# Patient Record
Sex: Female | Born: 1950 | Race: White | Hispanic: No | Smoking: Former smoker
Health system: Southern US, Community
[De-identification: ages and names within clinical notes are randomized; demographics above are authoritative.]

## PROBLEM LIST (undated history)

## (undated) DIAGNOSIS — I2510 Atherosclerotic heart disease of native coronary artery without angina pectoris: Secondary | ICD-10-CM

## (undated) DIAGNOSIS — E039 Hypothyroidism, unspecified: Secondary | ICD-10-CM

## (undated) DIAGNOSIS — M069 Rheumatoid arthritis, unspecified: Secondary | ICD-10-CM

## (undated) DIAGNOSIS — I5189 Other ill-defined heart diseases: Secondary | ICD-10-CM

## (undated) DIAGNOSIS — E119 Type 2 diabetes mellitus without complications: Secondary | ICD-10-CM

## (undated) DIAGNOSIS — I739 Peripheral vascular disease, unspecified: Secondary | ICD-10-CM

## (undated) DIAGNOSIS — Z720 Tobacco use: Secondary | ICD-10-CM

## (undated) HISTORY — DX: Atherosclerotic heart disease of native coronary artery without angina pectoris: I25.10

## (undated) HISTORY — DX: Rheumatoid arthritis, unspecified: M06.9

## (undated) HISTORY — PX: THYROID SURGERY: SHX805

## (undated) HISTORY — DX: Type 2 diabetes mellitus without complications: E11.9

## (undated) HISTORY — DX: Hypothyroidism, unspecified: E03.9

## (undated) HISTORY — DX: Tobacco use: Z72.0

## (undated) HISTORY — DX: Other ill-defined heart diseases: I51.89

## (undated) HISTORY — PX: LYMPHADENECTOMY: SHX15

## (undated) HISTORY — DX: Peripheral vascular disease, unspecified: I73.9

## (undated) HISTORY — PX: OTHER SURGICAL HISTORY: SHX169

## (undated) HISTORY — PX: CHOLECYSTECTOMY: SHX55

---

## 2010-07-05 ENCOUNTER — Encounter
Admission: RE | Admit: 2010-07-05 | Discharge: 2010-07-05 | Payer: Self-pay | Source: Home / Self Care | Attending: Internal Medicine | Admitting: Internal Medicine

## 2011-07-23 ENCOUNTER — Other Ambulatory Visit: Payer: Self-pay | Admitting: Interventional Cardiology

## 2011-07-24 ENCOUNTER — Inpatient Hospital Stay (HOSPITAL_BASED_OUTPATIENT_CLINIC_OR_DEPARTMENT_OTHER)
Admission: RE | Admit: 2011-07-24 | Discharge: 2011-07-24 | Disposition: A | Discharge status: Home / Self Care | Payer: 59 | Source: Ambulatory Visit | Attending: Interventional Cardiology | Admitting: Interventional Cardiology

## 2011-07-24 ENCOUNTER — Encounter (HOSPITAL_BASED_OUTPATIENT_CLINIC_OR_DEPARTMENT_OTHER)
Admission: RE | Disposition: A | Discharge status: Home / Self Care | Payer: Self-pay | Source: Ambulatory Visit | Attending: Interventional Cardiology

## 2011-07-24 DIAGNOSIS — R079 Chest pain, unspecified: Secondary | ICD-10-CM | POA: Insufficient documentation

## 2011-07-24 DIAGNOSIS — I2510 Atherosclerotic heart disease of native coronary artery without angina pectoris: Secondary | ICD-10-CM | POA: Insufficient documentation

## 2011-07-24 LAB — POCT I-STAT GLUCOSE: Operator id: 141321

## 2011-07-24 SURGERY — JV LEFT HEART CATHETERIZATION WITH CORONARY ANGIOGRAM
Anesthesia: Moderate Sedation

## 2011-07-24 MED ORDER — SODIUM CHLORIDE 0.9 % IV SOLN
INTRAVENOUS | Status: AC
Start: 2011-07-24 — End: 2011-07-24

## 2011-07-24 MED ORDER — SODIUM CHLORIDE 0.9 % IJ SOLN: 3.0000 mL | INTRAMUSCULAR | Status: DC | PRN

## 2011-07-24 MED ORDER — ISOSORBIDE MONONITRATE ER 30 MG PO TB24: 30.0000 mg | ORAL_TABLET | Freq: Every day | ORAL | Status: DC

## 2011-07-24 MED ORDER — SODIUM CHLORIDE 0.9 % IV SOLN: 250.0000 mL | INTRAVENOUS | Status: DC | PRN

## 2011-07-24 MED ORDER — SODIUM CHLORIDE 0.9 % IJ SOLN: 3.0000 mL | Freq: Two times a day (BID) | INTRAMUSCULAR | Status: DC

## 2011-07-24 MED ORDER — SODIUM CHLORIDE 0.9 % IV SOLN
INTRAVENOUS | Status: DC
  Administered 2011-07-24: 13:00:00 via INTRAVENOUS

## 2011-07-24 MED ORDER — ASPIRIN 81 MG PO CHEW
324.0000 mg | CHEWABLE_TABLET | ORAL | Status: AC
  Administered 2011-07-24: 324 mg via ORAL

## 2011-07-24 MED ORDER — ONDANSETRON HCL 4 MG/2ML IJ SOLN: 4.0000 mg | Freq: Four times a day (QID) | INTRAMUSCULAR | Status: DC | PRN

## 2011-07-24 MED ORDER — MORPHINE SULFATE 2 MG/ML IJ SOLN: 1.0000 mg | INTRAMUSCULAR | Status: DC | PRN

## 2011-07-24 MED ORDER — ACETAMINOPHEN 325 MG PO TABS: 650.0000 mg | ORAL_TABLET | ORAL | Status: DC | PRN

## 2011-07-24 MED ORDER — DIAZEPAM 5 MG PO TABS
5.0000 mg | ORAL_TABLET | ORAL | Status: AC
  Administered 2011-07-24: 5 mg via ORAL

## 2011-07-24 MED ORDER — ASPIRIN 81 MG PO CHEW: 81.0000 mg | CHEWABLE_TABLET | Freq: Every day | ORAL | Status: DC

## 2011-07-24 NOTE — Progress Notes (Signed)
Discharge instructions completed, patient voiced verbal understanding, sister-in-law present while instructions given.  Ambulated to bathroom without bleeding from right groin site.  IV discontinued.  Discharged to home via wheelchair with sister-in-law.

## 2011-07-24 NOTE — OR Nursing (Signed)
Tegaderm dressing applied, site level 0, bedrest begins at 1345

## 2011-07-24 NOTE — H&P (Signed)
Date of Initial H&P: 07/22/11  History reviewed, patient examined, no change in status, stable for surgery.

## 2011-07-24 NOTE — Op Note (Signed)
PROCEDURE:  Left heart catheterization with selective coronary angiography, left ventriculogram.  INDICATIONS:  Persistent chest pain  The risks, benefits, and details of the procedure were explained to the patient.  The patient verbalized understanding and wanted to proceed.  Informed written consent was obtained.  PROCEDURE TECHNIQUE:  After Xylocaine anesthesia a 29F sheath was placed in the right femoral artery with a single anterior needle wall stick.   Left coronary angiography was done using a Judkins L4 guide catheter.  Right coronary angiography was done using a Judkins R4 guide catheter.  Left ventriculography was done using a pigtail catheter.  The pigtail catheter was withdrawn to the abdominal aorta and a power injection of contrast was performed in the AP projection to image the abdominal aorta.   CONTRAST:  Total of 70 cc.  COMPLICATIONS:  None.    HEMODYNAMICS:  Aortic pressure was 82/46; LV pressure was 82/5; LVEDP 6.  There was no gradient between the left ventricle and aorta.    ANGIOGRAPHIC DATA:   The left main coronary artery is widely patent.  The left anterior descending artery is a large vessel which reaches.  The vessel appears angiographically normal.  The distal vessel is small..  The left circumflex artery is a large dominant vessel.  There is a high OM 1.  There is an ostial 25% stenosis in the OM 1.  This is a very small vessel.  There is a large branching OM 2.  In the lower pole, there is a focal 40% stenosis.  This is also a fairly small vessel.  There is a medium-sized OM 3.  This appears widely patent.  The left PDA is widely patent.  The right coronary artery is a small codominant vessel.  The proximal vessel is a medium-sized.  There is an early bifurcation of the posterolateral and PDA.  After nitroglycerin, right coronary artery did increase somewhat in size.  In the mid vessel, just before the bifurcation of the PDA and posterior lateral there is a focal  70% stenosis.  The distal PDA is small.  LEFT VENTRICULOGRAM:  Left ventricular angiogram was done in the 30 RAO projection and revealed normal left ventricular wall motion and systolic function with an estimated ejection fraction of 60%.  LVEDP was 6 mmHg.  ABDOMINAL AORTOGRAM:  Mild plaque noted in the infrarenal aorta.  Bilateral single renal arteries.  Left renal artery is widely patent.  Right renal artery has an ostial 40% stenosis.  IMPRESSIONS:  1. Normal left main coronary artery. 2. Normal left anterior descending artery and its branches. 3. Normal left circumflex artery.  Mild disease in the circumflex artery branches. 4.  small right coronary artery.  In the mid vessel, there is a focal 70% stenosis.  This vessel is quite small and is likely smaller than our smallest stent.  He  5. Normal left ventricular systolic function.  LVEDP 6  mmHg.  Ejection fraction 60%. 6.   no abdominal aortic aneurysm.  40% ostial right renal artery stenosis.  RECOMMENDATION:  Normal flow in the left system.  Moderate disease in the mid right coronary artery which is quite small.  This is not a candidate for percutaneous intervention due to the fact that the vessel is smaller than the smallest stent available.  We'll start medical therapy with him  Imdur.  Consider adding Plavix.  For now, start baby aspirin.

## 2011-12-10 ENCOUNTER — Other Ambulatory Visit: Payer: Self-pay | Admitting: Internal Medicine

## 2011-12-10 DIAGNOSIS — C73 Malignant neoplasm of thyroid gland: Secondary | ICD-10-CM

## 2011-12-20 ENCOUNTER — Other Ambulatory Visit: Payer: 59

## 2011-12-24 ENCOUNTER — Other Ambulatory Visit: Payer: 59

## 2011-12-27 ENCOUNTER — Ambulatory Visit
Admission: RE | Admit: 2011-12-27 | Discharge: 2011-12-27 | Disposition: A | Discharge status: Home / Self Care | Payer: 59 | Source: Ambulatory Visit | Attending: Internal Medicine | Admitting: Internal Medicine

## 2011-12-27 DIAGNOSIS — C73 Malignant neoplasm of thyroid gland: Secondary | ICD-10-CM

## 2011-12-31 ENCOUNTER — Other Ambulatory Visit: Payer: Self-pay | Admitting: Internal Medicine

## 2011-12-31 DIAGNOSIS — E041 Nontoxic single thyroid nodule: Secondary | ICD-10-CM

## 2012-01-15 ENCOUNTER — Other Ambulatory Visit (HOSPITAL_COMMUNITY)
Admission: RE | Admit: 2012-01-15 | Discharge: 2012-01-15 | Disposition: A | Discharge status: Home / Self Care | Payer: 59 | Source: Ambulatory Visit | Attending: Interventional Radiology | Admitting: Interventional Radiology

## 2012-01-15 ENCOUNTER — Other Ambulatory Visit: Payer: Self-pay | Admitting: Internal Medicine

## 2012-01-15 ENCOUNTER — Ambulatory Visit
Admission: RE | Admit: 2012-01-15 | Discharge: 2012-01-15 | Disposition: A | Discharge status: Home / Self Care | Payer: 59 | Source: Ambulatory Visit | Attending: Internal Medicine | Admitting: Internal Medicine

## 2012-01-15 DIAGNOSIS — E041 Nontoxic single thyroid nodule: Secondary | ICD-10-CM

## 2012-01-15 DIAGNOSIS — E049 Nontoxic goiter, unspecified: Secondary | ICD-10-CM | POA: Insufficient documentation

## 2013-05-18 ENCOUNTER — Ambulatory Visit: Payer: 59 | Admitting: Interventional Cardiology

## 2016-03-21 ENCOUNTER — Ambulatory Visit (INDEPENDENT_AMBULATORY_CARE_PROVIDER_SITE_OTHER): Payer: No Typology Code available for payment source | Admitting: Nurse Practitioner

## 2016-03-21 ENCOUNTER — Encounter: Payer: Self-pay | Admitting: Nurse Practitioner

## 2016-03-21 VITALS — BP 122/69 | HR 72 | Ht 63.0 in | Wt 117.0 lb

## 2016-03-21 DIAGNOSIS — I739 Peripheral vascular disease, unspecified: Secondary | ICD-10-CM | POA: Diagnosis not present

## 2016-03-21 DIAGNOSIS — E119 Type 2 diabetes mellitus without complications: Secondary | ICD-10-CM | POA: Insufficient documentation

## 2016-03-21 DIAGNOSIS — Z720 Tobacco use: Secondary | ICD-10-CM | POA: Insufficient documentation

## 2016-03-21 DIAGNOSIS — I25119 Atherosclerotic heart disease of native coronary artery with unspecified angina pectoris: Secondary | ICD-10-CM

## 2016-03-21 DIAGNOSIS — E1140 Type 2 diabetes mellitus with diabetic neuropathy, unspecified: Secondary | ICD-10-CM

## 2016-03-21 DIAGNOSIS — Z794 Long term (current) use of insulin: Secondary | ICD-10-CM

## 2016-03-21 DIAGNOSIS — I208 Other forms of angina pectoris: Secondary | ICD-10-CM | POA: Diagnosis not present

## 2016-03-21 DIAGNOSIS — E785 Hyperlipidemia, unspecified: Secondary | ICD-10-CM

## 2016-03-21 DIAGNOSIS — I2510 Atherosclerotic heart disease of native coronary artery without angina pectoris: Secondary | ICD-10-CM | POA: Insufficient documentation

## 2016-03-21 DIAGNOSIS — I5189 Other ill-defined heart diseases: Secondary | ICD-10-CM | POA: Insufficient documentation

## 2016-03-21 DIAGNOSIS — I119 Hypertensive heart disease without heart failure: Secondary | ICD-10-CM

## 2016-03-21 MED ORDER — ATORVASTATIN CALCIUM 40 MG PO TABS: 40.0000 mg | ORAL_TABLET | Freq: Every day | ORAL | 6 refills | Status: DC

## 2016-03-21 MED ORDER — ISOSORBIDE MONONITRATE ER 30 MG PO TB24: 30.0000 mg | ORAL_TABLET | Freq: Every day | ORAL | 6 refills | Status: DC

## 2016-03-21 NOTE — Patient Instructions (Signed)
Your physician has requested that you have a lower extremity arterial exercise duplex. During this test, exercise and ultrasound are used to evaluate arterial blood flow in the legs. Allow one hour for this exam. There are no restrictions or special instructions.   Your physician has requested that you have a lexiscan myoview. For further information please visit [email or web address]. Please follow instruction sheet, as given.  Your physician recommends that you schedule a follow-up appointment in: 1 month with Dr Berline Lopes or team member.

## 2016-03-21 NOTE — Progress Notes (Signed)
Cardiology Clinic Note   Patient Name: Tiffany James Date of Encounter: 03/21/2016  Primary Care Provider:  Kandice Hams, MD Primary Cardiologist:  Lendell Caprice, MD (last seen 07/2011)  Patient Profile    65 year old female with prior history of small vessel CAD, hypertension, hyperlipidemia, diabetes, and tobacco abuse who presents to reestablish care.  Past Medical History    Past Medical History:  Diagnosis Date  . CAD (coronary artery disease)    a. 07/2011 Cath: LM nl, LAD nl, LCX mild dzs in branches, RCA small, 66m, EF 60%, R Renal 40ost-->Med Rx.  . Claudication ([place])   . Diastolic dysfunction    a. 03/2016 Echo: EF 60-65%, mild asymmetric LVH, abnl diast fxn.  . Hypothyroidism    a. in setting of thyroid resection x 2-->TSH nl 03/2016, now off of synthroid.  . Rheumatoid arthritis ([place])   . Tobacco abuse   . Type 2 diabetes mellitus ([place])    Past Surgical History:  Procedure Laterality Date  . CESAREAN SECTION    . CHOLECYSTECTOMY    . Ectopic Pregnancy x 2    . LYMPHADENECTOMY    . Resection of Insulinoma    . THYROID SURGERY      Allergies  Allergies  Allergen Reactions  . Metformin And Related Nausea Only  . Secobarbital Sodium Anxiety    History of Present Illness    65 year old female with the above complex past medical history including nonobstructive CAD, hypertension, hyperlipidemia, diabetes, and tobacco abuse. She was last seen in our office by Dr. Irish Lack in January 2013 with complaints of chest pain. She underwent diagnostic cardiac catheterization in January 2013 which revealed moderate, small vessel right coronary artery disease of approximately 70%. LV function was normal. She was placed on Imdur therapy. The right coronary artery was not felt to be ideal for intervention because of its small size. She has not followed up in several years but says that on average, she experiences an episode of chest discomfort, generally with exertion, about 4-5  times per year, for which she will take a nitroglycerin with adequate relief. She has been out of her Imdur for about a year and says that over the past year, she has had some increase frequency of chest discomfort, now occurring several times per week. She also reports bilateral left greater than right calf claudication especially with walking up inclines. She does not express dyspnea on exertion. She continues to smoke about half a pack per day but with the news that she'll be having a grand child, but the spring of 2018, she is now much more motivated to quit and does plan on doing so.  She was recently hospitalized at [facility] because of bilateral lower extremity swelling. This is been occurring periodically for several weeks, most notable at the end of the day and absent first thing in the morning. When it was more persistent on one particular day, she presented to [facility]. I have reviewed her records in care everywhere today. She was treated with 1 dose of IV Lasix in the ER with significant improvement in edema. Her BNP was normal. Troponin was normal. Chest x-ray did not show heart failure. Echo was performed and showed normal LV function with diastolic dysfunction. Lower external child was negative for DVT. She was prescribed metoprolol and advised to follow-up with her primary care provider. She says that she has not been taking metoprolol because she has a history of low blood pressures and dizziness. She has  not had any significant lower extremity swelling since her ER visit. She denies any history of PND, orthopnea, palpitations, dizziness, syncope, or early satiety.  Home Medications    Prior to Admission medications   Medication Sig Start Date End Date Taking? Authorizing Provider  aspirin EC 81 MG tablet Take 1 tablet by mouth daily.   Yes Historical Provider, MD  insulin aspart (NOVOLOG) 100 UNIT/ML injection Inject into the skin as directed.   Yes Historical Provider, MD    insulin glargine (LANTUS) 100 UNIT/ML injection Inject 22 Units into the skin daily. 03/19/16  Yes Historical Provider, MD  nitroGLYCERIN (NITROSTAT) 0.4 MG SL tablet Place 1 tablet under the tongue as needed. X 3 doses   Yes Historical Provider, MD  atorvastatin (LIPITOR) 40 MG tablet Take 1 tablet (40 mg total) by mouth daily. 03/21/16 06/19/16  Rogelia Mire, NP  isosorbide mononitrate (IMDUR) 30 MG 24 hr tablet Take 1 tablet (30 mg total) by mouth daily. 03/21/16 06/19/16  Rogelia Mire, NP    Family History    Family History  Problem Relation Age of Onset  . Stroke Mother   . Esophageal cancer Mother   . Liver cancer Father 56    Social History    Social History   Social History  . Marital status: Widowed    Spouse name: N/A  . Number of children: N/A  . Years of education: N/A   Occupational History  . Radiology technician    Social History Main Topics  . Smoking status: Current Every Day Smoker    Packs/day: 0.50    Years: 23.00    Types: Cigarettes  . Smokeless tobacco: Never Used  . Alcohol use No  . Drug use: No  . Sexual activity: Not on file   Other Topics Concern  . Not on file   Social History Narrative   Lives in [place].  She has a large home and her son and dtr in law live on one side, while she lives on the other.  She does not routinely exercise.     Review of Systems    General:  No chills, fever, night sweats or weight changes.  Cardiovascular:  +++ exertional chest pain, no dyspnea on exertion, +++ dependent lower ext edema with recent hospitalization, no orthopnea, palpitations, paroxysmal nocturnal dyspnea. +++ L>R bilat LE claudication. Dermatological: No rash, lesions/masses Respiratory: No cough, dyspnea Urologic: No hematuria, dysuria Abdominal:   No nausea, vomiting, diarrhea, bright red blood per rectum, melena, or hematemesis Neurologic:  No visual changes, wkns, changes in mental status. All other systems reviewed and  are otherwise negative except as noted above.  Physical Exam    VS:  BP 122/69   Pulse 72   Ht 5\' 3"  (1.6 m)   Wt 117 lb (53.1 kg)   BMI 20.73 kg/m  , BMI Body mass index is 20.73 kg/m. GEN: Well nourished, well developed, in no acute distress.  HEENT: normal.  Neck: Supple, no JVD, carotid bruits, or masses. Cardiac: RRR, no murmurs, rubs, or gallops. No clubbing, cyanosis, edema.  Radials/DP/PT 1+ and equal bilaterally.  Respiratory:  Respirations regular and unlabored, clear to auscultation bilaterally. GI: Soft, nontender, nondistended, BS + x 4. MS: no deformity or atrophy. Skin: warm and dry, no rash. Neuro:  Strength and sensation are intact. Psych: Normal affect.  Accessory Clinical Findings    ECG - Regular sinus rhythm, 72, no acute ST or T changes  Assessment & Plan  1.  Chronic angina/CAD: Patient has a long history of left-sided chest discomfort that has been nitrate responsive in the past. Diagnostic catheterization in January 2013 revealed small vessel right coronary artery disease. She was initially medically treated with isosorbide mononitrate but ran out of this some time ago. She is interested in refilling this. Over the past year, she has had some progression of chest discomfort symptoms, now occurring a few times per week, generally with exertion, though also sometimes @ rest.  She will often take a sublingual nitroglycerin when symptoms occur and this does seem to help. Symptoms can last up to hours. Given prior history of small vessel coronary disease with ongoing risk factors including tobacco abuse and poorly controlled diabetes, I will arrange for a Lexiscan Myoview to assess for ischemia.  She will continue on aspirin and I  have represcribed Imdur 30 mg daily. Given risk factors, I have also prescribed Lipitor 40 mg. She says she has been on a statin before but stopped it for no particular reason. She was recently prescribed beta blocker therapy but  discontinued it on her own because she felt as though she was getting lightheaded when standing. She can remain off of it for now.   2. Hypertensive heart disease: Blood pressure is currently stable. As noted above, she had been prescribed metoprolol during her recent hospitalization at [facility]. She has not been taking this. As I'm starting isosorbide mononitrate, she can remain off of metoprolol for the time being.  3. Hyperlipidemia: She had previously been prescribed Lipitor but came off of it because she didn't feel like taking it. With her history of coronary disease and diabetes, she should be on a statin. I will resume this. We will plan to follow-up lipids and LFTs in 6 weeks.  4. Type 2 diabetes mellitus: This was recently identified a poorly controlled with a hemoglobin A1c of 11.6. She has follow-up with primary care. Adding statin as above. She was previously prescribed an ACE inhibitor but discontinued it because she felt that it was lowering her blood pressure too much.  5. Tobacco abuse: She is currently smoking half pack a day but is planning on quitting. Complete cessation advised.  6. Bilateral lower extremity claudication: Left worse than right. This is been occurring for some time now especially with walking up inclines. I will arrange for ABIs.  7. Disposition: Follow-up ABIs and stress testing. Follow-up in the office in one month with plan for follow-up lipids and LFTs in approximately 6-8 weeks.  Murray Hodgkins, NP 03/21/2016, 1:04 PM

## 2016-04-03 ENCOUNTER — Telehealth (HOSPITAL_COMMUNITY): Payer: Self-pay

## 2016-04-03 NOTE — Telephone Encounter (Signed)
Encounter complete. 

## 2016-04-05 ENCOUNTER — Other Ambulatory Visit: Payer: Self-pay | Admitting: Nurse Practitioner

## 2016-04-05 ENCOUNTER — Ambulatory Visit (HOSPITAL_COMMUNITY)
Admission: RE | Admit: 2016-04-05 | Discharge: 2016-04-05 | Disposition: A | Discharge status: Home / Self Care | Payer: No Typology Code available for payment source | Source: Ambulatory Visit | Attending: Cardiology | Admitting: Cardiology

## 2016-04-05 DIAGNOSIS — R9439 Abnormal result of other cardiovascular function study: Secondary | ICD-10-CM | POA: Insufficient documentation

## 2016-04-05 DIAGNOSIS — I208 Other forms of angina pectoris: Secondary | ICD-10-CM

## 2016-04-05 DIAGNOSIS — I6523 Occlusion and stenosis of bilateral carotid arteries: Secondary | ICD-10-CM | POA: Diagnosis not present

## 2016-04-05 DIAGNOSIS — I739 Peripheral vascular disease, unspecified: Secondary | ICD-10-CM

## 2016-04-05 LAB — MYOCARDIAL PERFUSION IMAGING
CHL CUP NUCLEAR SDS: 1
CHL CUP NUCLEAR SRS: 0
CHL CUP RESTING HR STRESS: 72 {beats}/min
LV dias vol: 71 mL (ref 46–106)
LV sys vol: 20 mL
NUC STRESS TID: 1.12
Peak HR: 85 {beats}/min
SSS: 1

## 2016-04-05 IMAGING — NM NM MISC PROCEDURE
6 series · 36 of 36 positions shown · non-contrast
Comparison: none

[Series 1: wbr rest · 6.40mm/px · 6 of 64 frames shown]
[frame 6/64]
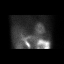
[frame 16/64]
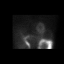
[frame 27/64]
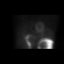
[frame 38/64]
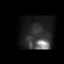
[frame 48/64]
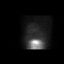
[frame 59/64]
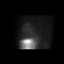

[Series 1: wbr_r-proj_st wbr rest · 6.40mm/px · 6 of 64 frames shown]
[frame 6/64]
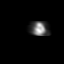
[frame 16/64]
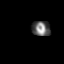
[frame 27/64]
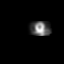
[frame 38/64]
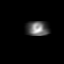
[frame 48/64]
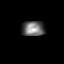
[frame 59/64]
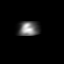

[Series 2: wbr_s-proj_st wbr stress-gsp · 6.40mm/px · 6 of 512 frames shown]
[frame 43/512]
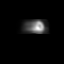
[frame 128/512]
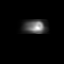
[frame 214/512]
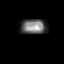
[frame 299/512]
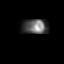
[frame 384/512]
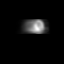
[frame 470/512]
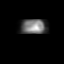

[Series 2: wbr stress-gsp · 6.40mm/px · 6 of 512 frames shown]
[frame 43/512]
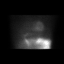
[frame 128/512]
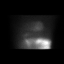
[frame 214/512]
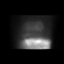
[frame 299/512]
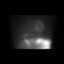
[frame 384/512]
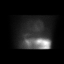
[frame 470/512]
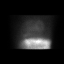

[Series 3: wbr_s-proj_st wbr stress-sum-em · 6.40mm/px · 6 of 64 frames shown]
[frame 6/64]
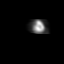
[frame 16/64]
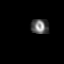
[frame 27/64]
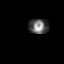
[frame 38/64]
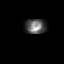
[frame 48/64]
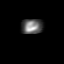
[frame 59/64]
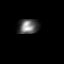

[Series 3: wbr stress-sum-em · 6.40mm/px · 6 of 64 frames shown]
[frame 6/64]
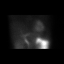
[frame 16/64]
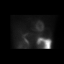
[frame 27/64]
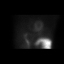
[frame 38/64]
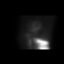
[frame 48/64]
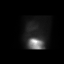
[frame 59/64]
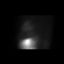

[36 of 36 positions shown; findings below may reference images not displayed]

Canned report from images found in remote index.

Refer to host system for actual result text.

## 2016-04-05 MED ORDER — TECHNETIUM TC 99M TETROFOSMIN IV KIT
31.3000 | PACK | Freq: Once | INTRAVENOUS | Status: AC | PRN
  Administered 2016-04-05: 31.3 via INTRAVENOUS
  Filled 2016-04-05: qty 31

## 2016-04-05 MED ORDER — REGADENOSON 0.4 MG/5ML IV SOLN
0.4000 mg | Freq: Once | INTRAVENOUS | Status: AC
  Administered 2016-04-05: 0.4 mg via INTRAVENOUS

## 2016-04-05 MED ORDER — TECHNETIUM TC 99M TETROFOSMIN IV KIT
10.9000 | PACK | Freq: Once | INTRAVENOUS | Status: AC | PRN
  Administered 2016-04-05: 10.9 via INTRAVENOUS
  Filled 2016-04-05: qty 11

## 2016-05-06 NOTE — Progress Notes (Signed)
Cardiology Office Note   Date:  05/07/2016   ID:  Tiffany James, DOB 1951/04/17, MRN [id number]  PCP:  Kandice Hams, MD    No chief complaint on file. f/u CAD   Wt Readings from Last 3 Encounters:  05/07/16 54.4 kg (120 lb)  04/05/16 53.1 kg (117 lb)  03/21/16 53.1 kg (117 lb)       History of Present Illness: Tiffany James is a 65 y.o. female  with CAD, hypertension, hyperlipidemia, diabetes, and tobacco abuse. She was last seen in our office by Dr. Irish Lack in January 2013 with complaints of chest pain. She underwent diagnostic cardiac catheterization in January 2013 which revealed moderate, small vessel right coronary artery disease of approximately 70%. LV function was normal. She was placed on Imdur therapy. The right coronary artery was not felt to be ideal for intervention because of its small size. She has not followed up in several years but says that on average, she experiences an episode of chest discomfort, generally with exertion, about 4-5 times per year, for which she will take a nitroglycerin with adequate relief.  Sx worse when she ran out of Imdur.  She also reports bilateral left greater than right calf claudication especially with walking up inclines. She does not express dyspnea on exertion. She continues to smoke about half a pack per day but with the news that she'll be having a grand child, but the spring of 2018, she is now much more motivated to quit and does plan on doing so.  She has not smoked for the past week.  SHe had been weaning herself off.    She has not used any SL NTG.  Imdur has helped.  Overall, she feels better now than she did in 2013.    She can have some left chest/shoulder discomfort with walking up a hill, but it resolves quickly with stopping.   She is trying to take better care of her DM as well.      Past Medical History:  Diagnosis Date  . CAD (coronary artery disease)    a. 07/2011 Cath: LM nl, LAD nl, LCX mild dzs in  branches, RCA small, 41m, EF 60%, R Renal 40ost-->Med Rx.  . Claudication ([place])   . Diastolic dysfunction    a. 03/2016 Echo: EF 60-65%, mild asymmetric LVH, abnl diast fxn.  . Hypothyroidism    a. in setting of thyroid resection x 2-->TSH nl 03/2016, now off of synthroid.  . Rheumatoid arthritis ([place])   . Tobacco abuse   . Type 2 diabetes mellitus ([place])     Past Surgical History:  Procedure Laterality Date  . CESAREAN SECTION    . CHOLECYSTECTOMY    . Ectopic Pregnancy x 2    . LYMPHADENECTOMY    . Resection of Insulinoma    . THYROID SURGERY       Current Outpatient Prescriptions  Medication Sig Dispense Refill  . aspirin EC 81 MG tablet Take 1 tablet by mouth daily.    Marland Kitchen atorvastatin (LIPITOR) 40 MG tablet Take 1 tablet (40 mg total) by mouth daily. 30 tablet 6  . furosemide (LASIX) 20 MG tablet Take 20 mg by mouth daily.    . insulin aspart (NOVOLOG) 100 UNIT/ML injection Inject into the skin as directed.    . insulin glargine (LANTUS) 100 UNIT/ML injection Inject 22 Units into the skin daily.    . isosorbide mononitrate (IMDUR) 30 MG 24 hr tablet Take 1 tablet (30 mg total)  by mouth daily. 30 tablet 6  . nitroGLYCERIN (NITROSTAT) 0.4 MG SL tablet Place 1 tablet under the tongue as needed. X 3 doses     No current facility-administered medications for this visit.     Allergies:   Metformin and related and Secobarbital sodium    Social History:  The patient  reports that she quit smoking today. Her smoking use included Cigarettes. She has a 11.50 pack-year smoking history. She has never used smokeless tobacco. She reports that she does not drink alcohol or use drugs.   Family History:  The patient's family history includes Esophageal cancer in her mother; Liver cancer (age of onset: 31) in her father; Stroke in her mother.    ROS:  Please see the history of present illness.   Otherwise, review of systems are positive for left foot pain- in brace for stress fracture.   All  other systems are reviewed and negative.    PHYSICAL EXAM: VS:  BP 126/72   Pulse 77   Ht 5\' 4"  (1.626 m)   Wt 54.4 kg (120 lb)   LMP  (LMP Unknown)   BMI 20.60 kg/m  , BMI Body mass index is 20.6 kg/m. GEN: Well nourished, well developed, in no acute distress  HEENT: normal  Neck: no JVD, carotid bruits, or masses Cardiac: RRR; no murmurs, rubs, or gallops,no edema  Respiratory:  clear to auscultation bilaterally, normal work of breathing GI: soft, nontender, nondistended, + BS MS: no deformity or atrophy ; left foot in a brace Skin: warm and dry, no rash Neuro:  Strength and sensation are intact Psych: euthymic mood, full affect     Recent Labs: No results found for requested labs within last 8760 hours.   Lipid Panel No results found for: CHOL, TRIG, HDL, CHOLHDL, VLDL, LDLCALC, LDLDIRECT   Other studies Reviewed: Additional studies/ records that were reviewed today with results demonstrating: recent cardiolite negative for ischemia.   ASSESSMENT AND PLAN:  1. CAD : Disease noted on 2013 cardiac cath in a small RCA. Symptoms are well controlled. Continue long-acting nitrates. If she has to use more sublingual nitroglycerin, would consider cardiac catheterization. Stress test was low risk. 2. Tobacco abuse: She has recently stopped smoking. I encouraged her to continue to abstain. 3. Hyperlipidemia: Continue atorvastatin. LDL target less than 100. 4. HTN: Well controlled. Continue current medicines. 5. PAD: Known small vessel disease in the legs. When her stress fracture is better, will encourage more exercise. 6. DM: followed by Dr. Buddy Duty.   Current medicines are reviewed at length with the patient today.  The patient concerns regarding her medicines were addressed.  The following changes have been made:  No change   Labs/ tests ordered today include:  No orders of the defined types were placed in this encounter.   Recommend 150 minutes/week of aerobic  exercise Low fat, low carb, high fiber diet recommended  Disposition:   FU in 1 year   Signed, Larae Grooms, MD  05/07/2016 12:38 PM    [facility] [address], [place], [place]  [address] Phone: [phone]; Fax: [phone]

## 2016-05-07 ENCOUNTER — Encounter (INDEPENDENT_AMBULATORY_CARE_PROVIDER_SITE_OTHER): Payer: Self-pay

## 2016-05-07 ENCOUNTER — Ambulatory Visit (INDEPENDENT_AMBULATORY_CARE_PROVIDER_SITE_OTHER): Payer: No Typology Code available for payment source | Admitting: Interventional Cardiology

## 2016-05-07 ENCOUNTER — Encounter: Payer: Self-pay | Admitting: Interventional Cardiology

## 2016-05-07 VITALS — BP 126/72 | HR 77 | Ht 64.0 in | Wt 120.0 lb

## 2016-05-07 DIAGNOSIS — Z720 Tobacco use: Secondary | ICD-10-CM

## 2016-05-07 DIAGNOSIS — E1140 Type 2 diabetes mellitus with diabetic neuropathy, unspecified: Secondary | ICD-10-CM

## 2016-05-07 DIAGNOSIS — I25119 Atherosclerotic heart disease of native coronary artery with unspecified angina pectoris: Secondary | ICD-10-CM

## 2016-05-07 DIAGNOSIS — Z794 Long term (current) use of insulin: Secondary | ICD-10-CM

## 2016-05-07 DIAGNOSIS — I739 Peripheral vascular disease, unspecified: Secondary | ICD-10-CM

## 2016-05-07 MED ORDER — NITROGLYCERIN 0.4 MG SL SUBL: 0.4000 mg | SUBLINGUAL_TABLET | SUBLINGUAL | 3 refills | Status: AC | PRN

## 2016-05-07 MED ORDER — ISOSORBIDE MONONITRATE ER 30 MG PO TB24: 30.0000 mg | ORAL_TABLET | Freq: Every day | ORAL | 3 refills | Status: AC

## 2016-05-07 MED ORDER — ATORVASTATIN CALCIUM 40 MG PO TABS: 40.0000 mg | ORAL_TABLET | Freq: Every day | ORAL | 3 refills | Status: AC

## 2016-05-07 MED ORDER — FUROSEMIDE 20 MG PO TABS: 20.0000 mg | ORAL_TABLET | Freq: Every day | ORAL | 3 refills | Status: AC

## 2016-05-07 NOTE — Patient Instructions (Signed)
**Note De-identified Tiffany James Obfuscation** Medication Instructions:  Same-no changes  Labwork: None  Testing/Procedures: None  Follow-Up: Your physician wants you to follow-up in: 1 year. You will receive a reminder letter in the mail two months in advance. If you don't receive a letter, please call our office to schedule the follow-up appointment.      If you need a refill on your cardiac medications before your next appointment, please call your pharmacy.   

## 2019-08-27 ENCOUNTER — Ambulatory Visit: Payer: No Typology Code available for payment source | Admitting: Interventional Cardiology

## 2022-01-29 DEATH — deceased
